# Patient Record
Sex: Male | Born: 1990 | Race: Black or African American | Hispanic: No | Marital: Single | Smoking: Current every day smoker
Health system: Southern US, Community
[De-identification: ages and names within clinical notes are randomized; demographics above are authoritative.]

---
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
## 2012-04-26 ENCOUNTER — Emergency Department (HOSPITAL_COMMUNITY)
Admission: EM | Admit: 2012-04-26 | Discharge: 2012-04-26 | Disposition: A | Discharge status: Home / Self Care | Payer: Self-pay | Source: Home / Self Care

## 2012-05-14 ENCOUNTER — Encounter (HOSPITAL_COMMUNITY): Payer: Self-pay | Admitting: *Deleted

## 2012-05-14 ENCOUNTER — Emergency Department (HOSPITAL_COMMUNITY)
Admission: EM | Admit: 2012-05-14 | Discharge: 2012-05-14 | Disposition: A | Discharge status: Home / Self Care | Payer: Medicaid Other | Attending: Emergency Medicine | Admitting: Emergency Medicine

## 2012-05-14 ENCOUNTER — Emergency Department (HOSPITAL_COMMUNITY): Payer: Medicaid Other

## 2012-05-14 DIAGNOSIS — S4980XA Other specified injuries of shoulder and upper arm, unspecified arm, initial encounter: Secondary | ICD-10-CM | POA: Insufficient documentation

## 2012-05-14 DIAGNOSIS — S6990XA Unspecified injury of unspecified wrist, hand and finger(s), initial encounter: Secondary | ICD-10-CM | POA: Insufficient documentation

## 2012-05-14 DIAGNOSIS — M25562 Pain in left knee: Secondary | ICD-10-CM

## 2012-05-14 DIAGNOSIS — Y929 Unspecified place or not applicable: Secondary | ICD-10-CM | POA: Insufficient documentation

## 2012-05-14 DIAGNOSIS — S99929A Unspecified injury of unspecified foot, initial encounter: Secondary | ICD-10-CM | POA: Insufficient documentation

## 2012-05-14 DIAGNOSIS — M79641 Pain in right hand: Secondary | ICD-10-CM

## 2012-05-14 DIAGNOSIS — S6980XA Other specified injuries of unspecified wrist, hand and finger(s), initial encounter: Secondary | ICD-10-CM | POA: Insufficient documentation

## 2012-05-14 DIAGNOSIS — X58XXXA Exposure to other specified factors, initial encounter: Secondary | ICD-10-CM | POA: Insufficient documentation

## 2012-05-14 DIAGNOSIS — Y9339 Activity, other involving climbing, rappelling and jumping off: Secondary | ICD-10-CM | POA: Insufficient documentation

## 2012-05-14 DIAGNOSIS — S46909A Unspecified injury of unspecified muscle, fascia and tendon at shoulder and upper arm level, unspecified arm, initial encounter: Secondary | ICD-10-CM | POA: Insufficient documentation

## 2012-05-14 DIAGNOSIS — F17200 Nicotine dependence, unspecified, uncomplicated: Secondary | ICD-10-CM | POA: Insufficient documentation

## 2012-05-14 DIAGNOSIS — S8990XA Unspecified injury of unspecified lower leg, initial encounter: Secondary | ICD-10-CM | POA: Insufficient documentation

## 2012-05-14 MED ORDER — IBUPROFEN 400 MG PO TABS
800.0000 mg | ORAL_TABLET | Freq: Once | ORAL | Status: DC
  Filled 2012-05-14: qty 2
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
## 2012-05-14 NOTE — Progress Notes (Signed)
Orthopedic Tech Progress Note Patient Details:  Aaron Mccall 11/16/1990 [id number] Applied knee immob. LLE, taught pt. proper use of crutches. Ortho Devices Type of Ortho Device: Buddy tape;Knee splint Ortho Device/Splint Interventions: Application   [name] 05/14/2012, 11:38 PM

## 2012-05-14 NOTE — ED Notes (Signed)
Pt reports RT arm and finger pain . Also Lt Leg pain, Pt reports pain and stiffness to RT small finger and RT ring finger.
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
## 2012-05-14 NOTE — ED Notes (Signed)
RT small finger and ring finger buddy taped and ace wrap to rt hand as ordered . Pt refused ibuprofen.

## 2012-05-18 NOTE — ED Provider Notes (Signed)
Medical screening examination/treatment/procedure(s) were performed by non-physician practitioner and as supervising physician I was immediately available for consultation/collaboration.  Gilda Crease, MD 05/18/12 1447

## 2013-03-12 ENCOUNTER — Encounter (HOSPITAL_COMMUNITY): Payer: Self-pay | Admitting: Certified Registered Nurse Anesthetist

## 2013-03-12 ENCOUNTER — Emergency Department (HOSPITAL_COMMUNITY): Payer: Self-pay | Admitting: Certified Registered Nurse Anesthetist

## 2013-03-12 ENCOUNTER — Emergency Department (HOSPITAL_COMMUNITY)
Admission: EM | Admit: 2013-03-12 | Discharge: 2013-03-24 | Disposition: E | Payer: Medicaid Other | Attending: Emergency Medicine | Admitting: Emergency Medicine

## 2013-03-12 ENCOUNTER — Encounter (HOSPITAL_COMMUNITY): Admission: EM | Disposition: E | Payer: Self-pay | Source: Home / Self Care | Attending: Emergency Medicine

## 2013-03-12 ENCOUNTER — Other Ambulatory Visit: Payer: Self-pay

## 2013-03-12 ENCOUNTER — Emergency Department (HOSPITAL_COMMUNITY): Payer: Self-pay

## 2013-03-12 DIAGNOSIS — R231 Pallor: Secondary | ICD-10-CM | POA: Insufficient documentation

## 2013-03-12 DIAGNOSIS — J95821 Acute postprocedural respiratory failure: Secondary | ICD-10-CM

## 2013-03-12 DIAGNOSIS — S21209A Unspecified open wound of unspecified back wall of thorax without penetration into thoracic cavity, initial encounter: Secondary | ICD-10-CM | POA: Insufficient documentation

## 2013-03-12 DIAGNOSIS — S21219A Laceration without foreign body of unspecified back wall of thorax without penetration into thoracic cavity, initial encounter: Secondary | ICD-10-CM

## 2013-03-12 DIAGNOSIS — D500 Iron deficiency anemia secondary to blood loss (chronic): Secondary | ICD-10-CM

## 2013-03-12 DIAGNOSIS — X58XXXA Exposure to other specified factors, initial encounter: Secondary | ICD-10-CM | POA: Insufficient documentation

## 2013-03-12 DIAGNOSIS — D62 Acute posthemorrhagic anemia: Secondary | ICD-10-CM | POA: Insufficient documentation

## 2013-03-12 DIAGNOSIS — Y939 Activity, unspecified: Secondary | ICD-10-CM | POA: Insufficient documentation

## 2013-03-12 DIAGNOSIS — Y929 Unspecified place or not applicable: Secondary | ICD-10-CM | POA: Insufficient documentation

## 2013-03-12 DIAGNOSIS — R000 Tachycardia, unspecified: Secondary | ICD-10-CM | POA: Insufficient documentation

## 2013-03-12 DIAGNOSIS — T794XXA Traumatic shock, initial encounter: Secondary | ICD-10-CM

## 2013-03-12 LAB — CBC
HEMATOCRIT: 19.7 % — AB (ref 39.0–52.0)
HEMOGLOBIN: 6.8 g/dL — AB (ref 13.0–17.0)
MCH: 31.5 pg (ref 26.0–34.0)
MCHC: 34.5 g/dL (ref 30.0–36.0)
MCV: 91.2 fL (ref 78.0–100.0)
PLATELETS: 58 10*3/uL — AB (ref 150–400)
RBC: 2.16 MIL/uL — AB (ref 4.22–5.81)
RDW: 12.5 % (ref 11.5–15.5)
WBC: 2.8 10*3/uL — ABNORMAL LOW (ref 4.0–10.5)

## 2013-03-12 LAB — POCT I-STAT, CHEM 8
BUN: 9 mg/dL (ref 6–23)
Calcium, Ion: 0.81 mmol/L — ABNORMAL LOW (ref 1.12–1.23)
Chloride: 115 mEq/L — ABNORMAL HIGH (ref 96–112)
Creatinine, Ser: 1 mg/dL (ref 0.50–1.35)
Glucose, Bld: 35 mg/dL — CL (ref 70–99)
HCT: 23 % — ABNORMAL LOW (ref 39.0–52.0)
HEMOGLOBIN: 7.8 g/dL — AB (ref 13.0–17.0)
POTASSIUM: 3.4 meq/L — AB (ref 3.7–5.3)
SODIUM: 155 meq/L — AB (ref 137–147)
TCO2: 12 mmol/L (ref 0–100)

## 2013-03-12 LAB — COMPREHENSIVE METABOLIC PANEL
ALBUMIN: 1.6 g/dL — AB (ref 3.5–5.2)
ALT: 7 U/L (ref 0–53)
AST: 12 U/L (ref 0–37)
Alkaline Phosphatase: 51 U/L (ref 39–117)
BUN: 8 mg/dL (ref 6–23)
CALCIUM: 4.2 mg/dL — AB (ref 8.4–10.5)
CO2: 9 mEq/L — CL (ref 19–32)
CREATININE: 0.78 mg/dL (ref 0.50–1.35)
Chloride: 123 mEq/L — ABNORMAL HIGH (ref 96–112)
GFR calc Af Amer: >90 mL/min (ref >90)
GFR calc non Af Amer: >90 mL/min (ref >90)
Glucose, Bld: 37 mg/dL — CL (ref 70–99)
Potassium: 3.1 mEq/L — ABNORMAL LOW (ref 3.7–5.3)
Sodium: 151 mEq/L — ABNORMAL HIGH (ref 137–147)
TOTAL PROTEIN: 3 g/dL — AB (ref 6.0–8.3)
Total Bilirubin: 0.2 mg/dL — ABNORMAL LOW (ref 0.3–1.2)

## 2013-03-12 LAB — PROTIME-INR
INR: 1.82 — ABNORMAL HIGH (ref 0.00–1.49)
Prothrombin Time: 20.5 seconds — ABNORMAL HIGH (ref 11.6–15.2)

## 2013-03-12 LAB — BLOOD PRODUCT ORDER (VERBAL) VERIFICATION

## 2013-03-12 LAB — CDS SEROLOGY

## 2013-03-12 LAB — CG4 I-STAT (LACTIC ACID): LACTIC ACID, VENOUS: 9.24 mmol/L — AB (ref 0.5–2.2)

## 2013-03-12 SURGERY — LAPAROTOMY, EXPLORATORY
Anesthesia: General

## 2013-03-12 MED ORDER — SODIUM BICARBONATE 8.4 % IV SOLN
INTRAVENOUS | Status: AC | PRN
  Administered 2013-03-12 (×2): 100 meq via INTRAVENOUS

## 2013-03-12 MED ORDER — FENTANYL CITRATE 0.05 MG/ML IJ SOLN
INTRAMUSCULAR | Status: AC
  Filled 2013-03-12: qty 5

## 2013-03-12 MED ORDER — SUCCINYLCHOLINE CHLORIDE 20 MG/ML IJ SOLN
INTRAMUSCULAR | Status: AC
  Filled 2013-03-12: qty 1

## 2013-03-12 MED ORDER — EPINEPHRINE HCL 0.1 MG/ML IJ SOSY
PREFILLED_SYRINGE | INTRAMUSCULAR | Status: AC | PRN
  Administered 2013-03-12 (×4): 1 mg via INTRAVENOUS

## 2013-03-12 MED ORDER — MIDAZOLAM HCL 2 MG/2ML IJ SOLN
INTRAMUSCULAR | Status: AC
  Filled 2013-03-12: qty 4

## 2013-03-12 MED ORDER — ROCURONIUM BROMIDE 50 MG/5ML IV SOLN
INTRAVENOUS | Status: AC
  Filled 2013-03-12: qty 2

## 2013-03-12 MED ORDER — ETOMIDATE 2 MG/ML IV SOLN
INTRAVENOUS | Status: AC | PRN
  Administered 2013-03-12: 30 mg via INTRAVENOUS

## 2013-03-12 MED ORDER — SODIUM CHLORIDE 0.9 % IV SOLN
1000.0000 mg | INTRAVENOUS | Status: AC
  Administered 2013-03-12: 1000 mg via INTRAVENOUS
  Filled 2013-03-12: qty 10

## 2013-03-12 MED ORDER — MIDAZOLAM HCL 10 MG/2ML IJ SOLN
INTRAMUSCULAR | Status: AC
  Filled 2013-03-12: qty 2

## 2013-03-12 MED ORDER — LIDOCAINE HCL (CARDIAC) 20 MG/ML IV SOLN
INTRAVENOUS | Status: AC
  Filled 2013-03-12: qty 5

## 2013-03-12 MED ORDER — ROCURONIUM BROMIDE 50 MG/5ML IV SOLN
INTRAVENOUS | Status: AC | PRN
  Administered 2013-03-12: 100 mg via INTRAVENOUS

## 2013-03-12 MED ORDER — SODIUM CHLORIDE 0.9 % IJ SOLN
INTRAMUSCULAR | Status: AC
  Filled 2013-03-12: qty 10

## 2013-03-12 MED ORDER — PROPOFOL 10 MG/ML IV BOLUS
INTRAVENOUS | Status: AC
  Filled 2013-03-12: qty 20

## 2013-03-12 MED ORDER — ETOMIDATE 2 MG/ML IV SOLN
INTRAVENOUS | Status: AC
  Filled 2013-03-12: qty 20

## 2013-03-12 SURGICAL SUPPLY — 41 items
BLADE SURG ROTATE 9660 (MISCELLANEOUS) IMPLANT
CANISTER SUCTION 2500CC (MISCELLANEOUS) ×3 IMPLANT
CHLORAPREP W/TINT 26ML (MISCELLANEOUS) ×3 IMPLANT
COVER MAYO STAND STRL (DRAPES) IMPLANT
COVER SURGICAL LIGHT HANDLE (MISCELLANEOUS) ×3 IMPLANT
DRAPE LAPAROSCOPIC ABDOMINAL (DRAPES) ×3 IMPLANT
DRAPE PROXIMA HALF (DRAPES) IMPLANT
DRAPE UTILITY 15X26 W/TAPE STR (DRAPE) ×6 IMPLANT
DRAPE WARM FLUID 44X44 (DRAPE) ×3 IMPLANT
DRSG OPSITE POSTOP 4X10 (GAUZE/BANDAGES/DRESSINGS) IMPLANT
DRSG OPSITE POSTOP 4X8 (GAUZE/BANDAGES/DRESSINGS) IMPLANT
ELECT BLADE 6.5 EXT (BLADE) IMPLANT
ELECT CAUTERY BLADE 6.4 (BLADE) ×6 IMPLANT
ELECT REM PT RETURN 9FT ADLT (ELECTROSURGICAL) ×3
ELECTRODE REM PT RTRN 9FT ADLT (ELECTROSURGICAL) ×1 IMPLANT
GLOVE BIO SURGEON STRL SZ8 (GLOVE) ×3 IMPLANT
GLOVE BIOGEL PI IND STRL 8 (GLOVE) ×1 IMPLANT
GLOVE BIOGEL PI INDICATOR 8 (GLOVE) ×2
GOWN STRL NON-REIN LRG LVL3 (GOWN DISPOSABLE) ×6 IMPLANT
GOWN STRL REIN XL XLG (GOWN DISPOSABLE) ×3 IMPLANT
KIT BASIN OR (CUSTOM PROCEDURE TRAY) ×3 IMPLANT
KIT ROOM TURNOVER OR (KITS) ×3 IMPLANT
LIGASURE IMPACT 36 18CM CVD LR (INSTRUMENTS) IMPLANT
NS IRRIG 1000ML POUR BTL (IV SOLUTION) ×6 IMPLANT
PACK GENERAL/GYN (CUSTOM PROCEDURE TRAY) ×3 IMPLANT
PAD ARMBOARD 7.5X6 YLW CONV (MISCELLANEOUS) ×3 IMPLANT
PENCIL BUTTON HOLSTER BLD 10FT (ELECTRODE) IMPLANT
SPECIMEN JAR LARGE (MISCELLANEOUS) IMPLANT
SPONGE LAP 18X18 X RAY DECT (DISPOSABLE) IMPLANT
STAPLER VISISTAT 35W (STAPLE) ×3 IMPLANT
SUCTION POOLE TIP (SUCTIONS) ×3 IMPLANT
SUT PDS AB 1 TP1 96 (SUTURE) ×6 IMPLANT
SUT SILK 2 0 SH CR/8 (SUTURE) ×3 IMPLANT
SUT SILK 2 0 TIES 10X30 (SUTURE) ×3 IMPLANT
SUT SILK 3 0 SH CR/8 (SUTURE) ×3 IMPLANT
SUT SILK 3 0 TIES 10X30 (SUTURE) ×3 IMPLANT
TOWEL OR 17X26 10 PK STRL BLUE (TOWEL DISPOSABLE) ×3 IMPLANT
TRAY FOLEY CATH 16FRSI W/METER (SET/KITS/TRAYS/PACK) IMPLANT
TUBE CONNECTING 12'X1/4 (SUCTIONS)
TUBE CONNECTING 12X1/4 (SUCTIONS) IMPLANT
YANKAUER SUCT BULB TIP NO VENT (SUCTIONS) IMPLANT

## 2013-03-12 NOTE — Procedures (Signed)
Central Venous Catheter Insertion Procedure Note Aaron Mccall [id number] 04/10/1990  Procedure: Insertion of Central Venous Catheter Indications: Drug and/or fluid administration  Procedure Details Consent: Unable to obtain consent because of altered level of consciousness. Time Out: Verified patient identification, verified procedure, site/side was marked, verified correct patient position, special equipment/implants available, medications/allergies/relevent history reviewed, required imaging and test results available.  Performed  Maximum sterile technique was used including antiseptics, gloves, gown, hand hygiene, mask and sheet. Skin prep: Chlorhexidine; local anesthetic administered A antimicrobial bonded/coated triple lumen catheter was placed in the left subclavian vein using the Seldinger technique.  Evaluation Blood flow good Complications: No apparent complications Chest X-ray ordered to verify placement.  Aaron Mccall Aaron Mccall 11-24-2013, 3:46 PM
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
## 2013-03-12 NOTE — ED Notes (Signed)
Notified Aaron Mccall donor of death at [id number], referral [id number]#03/07/[id number]
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
## 2013-03-12 NOTE — ED Notes (Signed)
Patient time of death occurred at [id number] .
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
## 2013-03-13 LAB — PREPARE FRESH FROZEN PLASMA
UNIT DIVISION: 0
UNIT DIVISION: 0
UNIT DIVISION: 0
Unit division: 0
Unit division: 0
Unit division: 0

## 2013-03-13 LAB — TYPE AND SCREEN
ABO/RH(D): O POS
Antibody Screen: NEGATIVE
Unit division: 0
Unit division: 0
Unit division: 0
Unit division: 0

## 2013-03-13 MED FILL — Medication: Qty: 1 | Status: AC

## 2013-03-24 DEATH — deceased
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
## 2014-02-06 ENCOUNTER — Encounter (HOSPITAL_COMMUNITY): Payer: Self-pay | Admitting: General Surgery
